# Patient Record
Sex: Male | Born: 2000 | Race: White | Hispanic: No | Marital: Single
Health system: Southern US, Community
[De-identification: ages and names within clinical notes are randomized; demographics above are authoritative.]

## PROBLEM LIST (undated history)

## (undated) DIAGNOSIS — J45909 Unspecified asthma, uncomplicated: Secondary | ICD-10-CM

## (undated) HISTORY — DX: Unspecified asthma, uncomplicated: J45.909

---
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
## 2020-03-13 ENCOUNTER — Ambulatory Visit: Payer: Self-pay | Admitting: Physician Assistant
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
## 2021-12-05 ENCOUNTER — Other Ambulatory Visit: Payer: Self-pay

## 2021-12-05 ENCOUNTER — Emergency Department (HOSPITAL_COMMUNITY)
Admission: EM | Admit: 2021-12-05 | Discharge: 2021-12-05 | Disposition: A | Discharge status: Home / Self Care | Payer: No Typology Code available for payment source | Attending: Emergency Medicine | Admitting: Emergency Medicine

## 2021-12-05 ENCOUNTER — Encounter (HOSPITAL_COMMUNITY): Payer: Self-pay

## 2021-12-05 DIAGNOSIS — Y92410 Unspecified street and highway as the place of occurrence of the external cause: Secondary | ICD-10-CM | POA: Diagnosis not present

## 2021-12-05 DIAGNOSIS — R519 Headache, unspecified: Secondary | ICD-10-CM | POA: Diagnosis not present

## 2021-12-05 DIAGNOSIS — M542 Cervicalgia: Secondary | ICD-10-CM | POA: Diagnosis present

## 2021-12-05 MED ORDER — ACETAMINOPHEN 325 MG PO TABS
650.0000 mg | ORAL_TABLET | Freq: Once | ORAL | Status: AC
  Administered 2021-12-05: 650 mg via ORAL
  Filled 2021-12-05: qty 2

## 2021-12-05 NOTE — Discharge Instructions (Addendum)
Please refer to the attached instructions. Tylenol for discomfort/headache.
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
## 2024-12-08 ENCOUNTER — Telehealth: Payer: Self-pay | Admitting: Medical-Surgical

## 2024-12-08 NOTE — Telephone Encounter (Signed)
 Patients wife, Annaleise Gartin, called in wanting to schedule the patient a new patient appointment with you.

## 2025-02-04 ENCOUNTER — Ambulatory Visit: Payer: Self-pay | Admitting: Medical-Surgical
